# Patient Record
Sex: Male | Born: 1995 | Race: White | Hispanic: No | Marital: Single | Smoking: Former smoker
Health system: Southern US, Community
[De-identification: ages and names within clinical notes are randomized; demographics above are authoritative.]

## PROBLEM LIST (undated history)

## (undated) VITALS — BP 115/68 | HR 81 | Temp 97.7°F | Resp 17 | Ht 70.87 in | Wt 135.6 lb

## (undated) DIAGNOSIS — R011 Cardiac murmur, unspecified: Secondary | ICD-10-CM

## (undated) DIAGNOSIS — F419 Anxiety disorder, unspecified: Secondary | ICD-10-CM

## (undated) DIAGNOSIS — F909 Attention-deficit hyperactivity disorder, unspecified type: Secondary | ICD-10-CM

## (undated) HISTORY — PX: NO PAST SURGERIES: SHX2092

---
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
## 2014-01-27 ENCOUNTER — Encounter (HOSPITAL_COMMUNITY): Payer: Self-pay | Admitting: Emergency Medicine

## 2014-01-27 ENCOUNTER — Emergency Department (HOSPITAL_COMMUNITY)
Admission: EM | Admit: 2014-01-27 | Discharge: 2014-01-28 | Disposition: A | Discharge status: Home / Self Care | Attending: Emergency Medicine | Admitting: Emergency Medicine

## 2014-01-27 DIAGNOSIS — R011 Cardiac murmur, unspecified: Secondary | ICD-10-CM | POA: Insufficient documentation

## 2014-01-27 DIAGNOSIS — F1210 Cannabis abuse, uncomplicated: Secondary | ICD-10-CM | POA: Insufficient documentation

## 2014-01-27 DIAGNOSIS — E876 Hypokalemia: Secondary | ICD-10-CM | POA: Insufficient documentation

## 2014-01-27 DIAGNOSIS — F17200 Nicotine dependence, unspecified, uncomplicated: Secondary | ICD-10-CM | POA: Insufficient documentation

## 2014-01-27 DIAGNOSIS — F419 Anxiety disorder, unspecified: Secondary | ICD-10-CM

## 2014-01-27 DIAGNOSIS — F32A Depression, unspecified: Secondary | ICD-10-CM

## 2014-01-27 DIAGNOSIS — F3289 Other specified depressive episodes: Secondary | ICD-10-CM | POA: Insufficient documentation

## 2014-01-27 DIAGNOSIS — F411 Generalized anxiety disorder: Secondary | ICD-10-CM | POA: Insufficient documentation

## 2014-01-27 DIAGNOSIS — F329 Major depressive disorder, single episode, unspecified: Secondary | ICD-10-CM | POA: Insufficient documentation

## 2014-01-27 NOTE — ED Notes (Signed)
Pt states increased stress causing mood swings and panic attacks for last 2 months. Pt reports emesis and decreased appetite with feelings of anxiety. Pt denies SI/HI.

## 2014-01-28 LAB — COMPREHENSIVE METABOLIC PANEL
ALT: 12 U/L (ref 0–53)
AST: 14 U/L (ref 0–37)
Albumin: 4 g/dL (ref 3.5–5.2)
Alkaline Phosphatase: 79 U/L (ref 39–117)
BUN: 10 mg/dL (ref 6–23)
CALCIUM: 9.5 mg/dL (ref 8.4–10.5)
CO2: 28 meq/L (ref 19–32)
CREATININE: 0.98 mg/dL (ref 0.50–1.35)
Chloride: 98 mEq/L (ref 96–112)
Glucose, Bld: 105 mg/dL — ABNORMAL HIGH (ref 70–99)
Potassium: 3.1 mEq/L — ABNORMAL LOW (ref 3.7–5.3)
Sodium: 141 mEq/L (ref 137–147)
Total Bilirubin: 0.3 mg/dL (ref 0.3–1.2)
Total Protein: 7.1 g/dL (ref 6.0–8.3)

## 2014-01-28 LAB — URINALYSIS, ROUTINE W REFLEX MICROSCOPIC
Bilirubin Urine: NEGATIVE
Glucose, UA: NEGATIVE mg/dL
HGB URINE DIPSTICK: NEGATIVE
Ketones, ur: NEGATIVE mg/dL
LEUKOCYTES UA: NEGATIVE
NITRITE: NEGATIVE
Protein, ur: NEGATIVE mg/dL
SPECIFIC GRAVITY, URINE: 1.014 (ref 1.005–1.030)
UROBILINOGEN UA: 0.2 mg/dL (ref 0.0–1.0)
pH: 6 (ref 5.0–8.0)

## 2014-01-28 LAB — CBC WITH DIFFERENTIAL/PLATELET
BASOS ABS: 0 10*3/uL (ref 0.0–0.1)
Basophils Relative: 0 % (ref 0–1)
EOS PCT: 2 % (ref 0–5)
Eosinophils Absolute: 0.2 10*3/uL (ref 0.0–0.7)
HEMATOCRIT: 43.5 % (ref 39.0–52.0)
Hemoglobin: 15.4 g/dL (ref 13.0–17.0)
LYMPHS PCT: 31 % (ref 12–46)
Lymphs Abs: 3.5 10*3/uL (ref 0.7–4.0)
MCH: 29.1 pg (ref 26.0–34.0)
MCHC: 35.4 g/dL (ref 30.0–36.0)
MCV: 82.2 fL (ref 78.0–100.0)
MONO ABS: 0.8 10*3/uL (ref 0.1–1.0)
Monocytes Relative: 7 % (ref 3–12)
Neutro Abs: 6.7 10*3/uL (ref 1.7–7.7)
Neutrophils Relative %: 59 % (ref 43–77)
Platelets: 294 10*3/uL (ref 150–400)
RBC: 5.29 MIL/uL (ref 4.22–5.81)
RDW: 13 % (ref 11.5–15.5)
WBC: 11.3 10*3/uL — AB (ref 4.0–10.5)

## 2014-01-28 LAB — RAPID URINE DRUG SCREEN, HOSP PERFORMED
Amphetamines: NOT DETECTED
Barbiturates: NOT DETECTED
Benzodiazepines: NOT DETECTED
COCAINE: NOT DETECTED
OPIATES: NOT DETECTED
Tetrahydrocannabinol: POSITIVE — AB

## 2014-01-28 LAB — ETHANOL: Alcohol, Ethyl (B): <11 mg/dL (ref 0–11)

## 2014-01-28 MED ORDER — SODIUM CHLORIDE 0.9 % IV SOLN
1000.0000 mL | Freq: Once | INTRAVENOUS | Status: AC
  Administered 2014-01-28: 1000 mL via INTRAVENOUS

## 2014-01-28 MED ORDER — NICOTINE 14 MG/24HR TD PT24: 14.0000 mg | MEDICATED_PATCH | Freq: Every day | TRANSDERMAL | Status: DC

## 2014-01-28 MED ORDER — POTASSIUM CHLORIDE CRYS ER 20 MEQ PO TBCR: 20.0000 meq | EXTENDED_RELEASE_TABLET | Freq: Every day | ORAL | Status: AC

## 2014-01-28 MED ORDER — IBUPROFEN 200 MG PO TABS
600.0000 mg | ORAL_TABLET | Freq: Three times a day (TID) | ORAL | Status: DC | PRN
Start: 2014-01-28 — End: 2014-01-28

## 2014-01-28 MED ORDER — ONDANSETRON HCL 4 MG/2ML IJ SOLN: 4.0000 mg | Freq: Once | INTRAMUSCULAR | Status: DC

## 2014-01-28 MED ORDER — SODIUM CHLORIDE 0.9 % IV SOLN
1000.0000 mL | INTRAVENOUS | Status: DC
  Administered 2014-01-28: 1000 mL via INTRAVENOUS

## 2014-01-28 MED ORDER — POTASSIUM CHLORIDE CRYS ER 20 MEQ PO TBCR
40.0000 meq | EXTENDED_RELEASE_TABLET | ORAL | Status: DC
  Administered 2014-01-28: 40 meq via ORAL
  Filled 2014-01-28: qty 2

## 2014-01-28 MED ORDER — LORAZEPAM 1 MG PO TABS
1.0000 mg | ORAL_TABLET | Freq: Three times a day (TID) | ORAL | Status: DC | PRN
Start: 2014-01-28 — End: 2014-01-28

## 2014-01-28 MED ORDER — ONDANSETRON HCL 4 MG PO TABS: 4.0000 mg | ORAL_TABLET | Freq: Three times a day (TID) | ORAL | Status: DC | PRN

## 2014-01-28 MED ORDER — ALUM & MAG HYDROXIDE-SIMETH 200-200-20 MG/5ML PO SUSP: 30.0000 mL | ORAL | Status: DC | PRN

## 2014-01-28 MED ORDER — ZOLPIDEM TARTRATE 5 MG PO TABS: 5.0000 mg | ORAL_TABLET | Freq: Every evening | ORAL | Status: DC | PRN

## 2014-01-28 NOTE — ED Notes (Signed)
Pt is unable to urinate at this time. Pt has urinal.  

## 2014-01-28 NOTE — Discharge Instructions (Signed)
Generalized Anxiety Disorder Generalized anxiety disorder (GAD) is a mental disorder. It interferes with life functions, including relationships, work, and school. GAD is different from normal anxiety, which everyone experiences at some point in their lives in response to specific life events and activities. Normal anxiety actually helps [place] prepare for and get through these life events and activities. Normal anxiety goes away after the event or activity is over.  GAD causes anxiety that is not necessarily related to specific events or activities. It also causes excess anxiety in proportion to specific events or activities. The anxiety associated with GAD is also difficult to control. GAD can vary from mild to severe. People with severe GAD can have intense waves of anxiety with physical symptoms (panic attacks).  SYMPTOMS The anxiety and worry associated with GAD are difficult to control. This anxiety and worry are related to many life events and activities and also occur more days than not for 6 months or longer. People with GAD also have three or more of the following symptoms (one or more in children):  Restlessness.   Fatigue.  Difficulty concentrating.   Irritability.  Muscle tension.  Difficulty sleeping or unsatisfying sleep. DIAGNOSIS GAD is diagnosed through an assessment by your caregiver. Your caregiver will ask you questions aboutyour mood,physical symptoms, and events in your life. Your caregiver may ask you about your medical history and use of alcohol or drugs, including prescription medications. Your caregiver may also do a physical exam and blood tests. Certain medical conditions and the use of certain substances can cause symptoms similar to those associated with GAD. Your caregiver may refer you to a mental health specialist for further evaluation. TREATMENT The following therapies are usually used to treat GAD:   Medication Antidepressant medication usually is  prescribed for long-term daily control. Antianxiety medications may be added in severe cases, especially when panic attacks occur.   Talk therapy (psychotherapy) Certain types of talk therapy can be helpful in treating GAD by providing support, education, and guidance. A form of talk therapy called cognitive behavioral therapy can teach you healthy ways to think about and react to daily life events and activities.  Stress managementtechniques These include yoga, meditation, and exercise and can be very helpful when they are practiced regularly. A mental health specialist can help determine which treatment is best for you. Some people see improvement with one therapy. However, other people require a combination of therapies. Document Released: 02/21/2013 Document Reviewed: 02/21/2013 [facility] Patient Information 2014 [place], [place].  Depression, Adult Depression refers to feeling sad, low, down in the dumps, blue, gloomy, or empty. In general, there are two kinds of depression: 1. Depression that we all experience from time to time because of upsetting life experiences, including the loss of a job or the ending of a relationship (normal sadness or normal grief). This kind of depression is considered normal, is short lived, and resolves within a few days to 2 weeks. (Depression experienced after the loss of a loved one is called bereavement. Bereavement often lasts longer than 2 weeks but normally gets better with time.) 2. Clinical depression, which lasts longer than normal sadness or normal grief or interferes with your ability to function at home, at work, and in school. It also interferes with your personal relationships. It affects almost every aspect of your life. Clinical depression is an illness. Symptoms of depression also can be caused by conditions other than normal sadness and grief or clinical depression. Examples of these conditions are listed as  follows:  Physical illness Some physical  illnesses, including underactive thyroid gland (hypothyroidism), severe anemia, specific types of cancer, diabetes, uncontrolled seizures, heart and lung problems, strokes, and chronic pain are commonly associated with symptoms of depression.  Side effects of some prescription medicine In some people, certain types of prescription medicine can cause symptoms of depression.  Substance abuse Abuse of alcohol and illicit drugs can cause symptoms of depression. SYMPTOMS Symptoms of normal sadness and normal grief include the following:  Feeling sad or crying for short periods of time.  Not caring about anything (apathy).  Difficulty sleeping or sleeping too much.  No longer able to enjoy the things you used to enjoy.  Desire to be by oneself all the time (social isolation).  Lack of energy or motivation.  Difficulty concentrating or remembering.  Change in appetite or weight.  Restlessness or agitation. Symptoms of clinical depression include the same symptoms of normal sadness or normal grief and also the following symptoms:  Feeling sad or crying all the time.  Feelings of guilt or worthlessness.  Feelings of hopelessness or helplessness.  Thoughts of suicide or the desire to harm yourself (suicidal ideation).  Loss of touch with reality (psychotic symptoms). Seeing or hearing things that are not real (hallucinations) or having false beliefs about your life or the people around you (delusions and paranoia). DIAGNOSIS  The diagnosis of clinical depression usually is based on the severity and duration of the symptoms. Your caregiver also will ask you questions about your medical history and substance use to find out if physical illness, use of prescription medicine, or substance abuse is causing your depression. Your caregiver also may order blood tests. TREATMENT  Typically, normal sadness and normal grief do not require treatment. However, sometimes antidepressant medicine is  prescribed for bereavement to ease the depressive symptoms until they resolve. The treatment for clinical depression depends on the severity of your symptoms but typically includes antidepressant medicine, counseling with a mental health professional, or a combination of both. Your caregiver will help to determine what treatment is best for you. Depression caused by physical illness usually goes away with appropriate medical treatment of the illness. If prescription medicine is causing depression, talk with your caregiver about stopping the medicine, decreasing the dose, or substituting another medicine. Depression caused by abuse of alcohol or illicit drugs abuse goes away with abstinence from these substances. Some adults need professional help in order to stop drinking or using drugs. SEEK IMMEDIATE CARE IF:  You have thoughts about hurting yourself or others.  You lose touch with reality (have psychotic symptoms).  You are taking medicine for depression and have a serious side effect. FOR MORE INFORMATION National [employer] on Mental Illness: www.nami.[employer]: http://www.maynard.net/ Document Released: 10/24/2000 Document Revised: 04/27/2012 Document Reviewed: 01/26/2012 [facility] Patient Information 2014 [place], [place].  Potassium Salts tablets, extended-release tablets or capsules What is this medicine? POTASSIUM (poe TASS i um) is a natural salt that is important for the heart, muscles, and nerves. It is found in many foods and is normally supplied by a well balanced diet. This medicine is used to treat low potassium. This medicine may be used for other purposes; ask your health care provider or pharmacist if you have questions. COMMON BRAND NAME(S): ED-K+10, Glu-K, K-10 , K-8, K-Dur, K-Tab, Kaon-CL, Klor-Con M10, Klor-Con M15, Klor-Con M20, Klor-Con, Klotrix, Micro-K Extencaps, Micro-K, Slow-K What should I tell my health care provider before I take this  medicine? They need to  know if you have any of these conditions: -dehydration -diabetes -irregular heartbeat -kidney disease -stomach ulcers or other stomach problems -an unusual or allergic reaction to potassium salts, other medicines, foods, dyes, or preservatives -pregnant or trying to get pregnant -breast-feeding How should I use this medicine? Take this medicine by mouth with a full glass of water. Follow the directions on the prescription label. Take with food. Do not suck on, crush, or chew this medicine. If you have difficulty swallowing, ask the pharmacist how to take. Take your medicine at regular intervals. Do not take it more often than directed. Do not stop taking except on your doctor's advice. Talk to your pediatrician regarding the use of this medicine in children. Special care may be needed. Overdosage: If you think you have taken too much of this medicine contact a poison control center or emergency room at once. NOTE: This medicine is only for you. Do not share this medicine with others. What if I miss a dose? If you miss a dose, take it as soon as you can. If it is almost time for your next dose, take only that dose. Do not take double or extra doses. What may interact with this medicine? Do not take this medicine with any of the following medications: -eplerenone -sodium polystyrene sulfonate This medicine may also interact with the following medications: -medicines for blood pressure or heart disease like lisinopril, losartan, quinapril, valsartan -medicines for cold or allergies -medicines for inflammation like ibuprofen, indomethacin -medicines for Parkinson's disease -medicines for the stomach like metoclopramide, dicyclomine, glycopyrrolate -some diuretics This list may not describe all possible interactions. Give your health care provider a list of all the medicines, herbs, non-prescription drugs, or dietary supplements you use. Also tell them if you smoke,  drink alcohol, or use illegal drugs. Some items may interact with your medicine. What should I watch for while using this medicine? Visit your doctor or health care professional for regular check ups. You will need lab work done regularly. You may need to be on a special diet while taking this medicine. Ask your doctor. What side effects may I notice from receiving this medicine? Side effects that you should report to your doctor or health care professional as soon as possible: -allergic reactions like skin rash, itching or hives, swelling of the face, lips, or tongue -black, tarry stools -heartburn -irregular heartbeat -numbness or tingling in hands or feet -pain when swallowing -unusually weak or tired Side effects that usually do not require medical attention (report to your doctor or health care professional if they continue or are bothersome): -diarrhea -nausea -stomach gas -vomiting This list may not describe all possible side effects. Call your doctor for medical advice about side effects. You may report side effects to FDA at 1-800-FDA-1088. Where should I keep my medicine? Keep out of the reach of children. Store at room temperature between 15 and 30 degrees C (59 and 86 degrees F ). Keep bottle closed tightly to protect this medicine from light and moisture. Throw away any unused medicine after the expiration date. NOTE: This sheet is a summary. It may not cover all possible information. If you have questions about this medicine, talk to your doctor, pharmacist, or health care provider.  2014, [employer]. (2008-01-12 11:17:31)

## 2014-01-28 NOTE — ED Provider Notes (Addendum)
CSN: [id number]     Arrival date & time 01/27/14  2307 History   First MD Initiated Contact with Patient 01/28/14 0035     Chief Complaint  Patient presents with  . Anxiety     (Consider location/radiation/quality/duration/timing/severity/associated sxs/prior Treatment) Patient is a 18 y.o. male presenting with anxiety. The history is provided by the patient.  Anxiety  He complains of increased anxiety for the last 4-6 weeks. During this time, he states that he has not been eating, although he states that he has a normal appetite. There's been some intermittent vomiting. He admits to some depression but denies suicidal ideation or homicidal ideation. He has had early morning awakening but he denies crying spells or anhedonia. He denies hallucinations. He denies illicit drug use. He has had problems with anxiety in the past but this is worse than normal.  Past Medical History  Diagnosis Date  . ADHD (attention deficit hyperactivity disorder)   . Anxiety   . Heart murmur    Past Surgical History  Procedure Laterality Date  . No past surgeries     Family History  Problem Relation Age of Onset  . Mental illness Mother     bipolar, depression  . Alcohol abuse Mother   . Diabetes Maternal Grandmother   . Hyperlipidemia Maternal Grandmother   . Anxiety disorder Father   . Drug abuse Maternal Aunt    History  Substance Use Topics  . Smoking status: Current Every Day Smoker -- 1.00 packs/day for 2.5 years    Types: Cigarettes  . Smokeless tobacco: Current User    Types: Snuff  . Alcohol Use: No    Review of Systems  All other systems reviewed and are negative.      Allergies  Tylenol  Home Medications   Current Outpatient Rx  Name  Route  Sig  Dispense  Refill  . cyclobenzaprine (FLEXERIL) 10 MG tablet   Oral   Take 10 mg by mouth 3 (three) times daily as needed for muscle spasms.          BP 113/66  Pulse 98  Temp(Src) 98.7 F (37.1 C) (Oral)  Resp 16  Ht  6\' 1"  (1.854 m)  Wt 145 lb (65.772 kg)  BMI 19.13 kg/m2  SpO2 98% Physical Exam  Nursing note and vitals reviewed.  18 year old male, resting comfortably and in no acute distress. Vital signs are normal. Oxygen saturation is 98%, which is normal. Head is normocephalic and atraumatic. PERRLA, EOMI. Oropharynx is clear. Neck is nontender and supple without adenopathy or JVD. Back is nontender and there is no CVA tenderness. Lungs are clear without rales, wheezes, or rhonchi. Chest is nontender. Heart has regular rate and rhythm without murmur. Abdomen is soft, flat, nontender without masses or hepatosplenomegaly and peristalsis is normoactive. Extremities have no cyanosis or edema, full range of motion is present. Skin is warm and dry without rash. Neurologic: Mental status is normal, cranial nerves are intact, there are no motor or sensory deficits. Psychiatric: Somewhat depressed affect. He makes poor eye contact and voice is very soft and he makes poor eye contact.  ED Course  Procedures (including critical care time) Labs Review Results for orders placed during the hospital encounter of 01/27/14  CBC WITH DIFFERENTIAL      Result Value Ref Range   WBC 11.3 (*) 4.0 - 10.5 K/uL   RBC 5.29  4.22 - 5.81 MIL/uL   Hemoglobin 15.4  13.0 - 17.0 g/dL  HCT 43.5  39.0 - 52.0 %   MCV 82.2  78.0 - 100.0 fL   MCH 29.1  26.0 - 34.0 pg   MCHC 35.4  30.0 - 36.0 g/dL   RDW 16.1  09.6 - 04.5 %   Platelets 294  150 - 400 K/uL   Neutrophils Relative % 59  43 - 77 %   Neutro Abs 6.7  1.7 - 7.7 K/uL   Lymphocytes Relative 31  12 - 46 %   Lymphs Abs 3.5  0.7 - 4.0 K/uL   Monocytes Relative 7  3 - 12 %   Monocytes Absolute 0.8  0.1 - 1.0 K/uL   Eosinophils Relative 2  0 - 5 %   Eosinophils Absolute 0.2  0.0 - 0.7 K/uL   Basophils Relative 0  0 - 1 %   Basophils Absolute 0.0  0.0 - 0.1 K/uL  COMPREHENSIVE METABOLIC PANEL      Result Value Ref Range   Sodium 141  137 - 147 mEq/L   Potassium  3.1 (*) 3.7 - 5.3 mEq/L   Chloride 98  96 - 112 mEq/L   CO2 28  19 - 32 mEq/L   Glucose, Bld 105 (*) 70 - 99 mg/dL   BUN 10  6 - 23 mg/dL   Creatinine, Ser 4.09  0.50 - 1.35 mg/dL   Calcium 9.5  8.4 - 81.1 mg/dL   Total Protein 7.1  6.0 - 8.3 g/dL   Albumin 4.0  3.5 - 5.2 g/dL   AST 14  0 - 37 U/L   ALT 12  0 - 53 U/L   Alkaline Phosphatase 79  39 - 117 U/L   Total Bilirubin 0.3  0.3 - 1.2 mg/dL   GFR calc non Af Amer >90  >90 mL/min   GFR calc Af Amer >90  >90 mL/min  URINALYSIS, ROUTINE W REFLEX MICROSCOPIC      Result Value Ref Range   Color, Urine YELLOW  YELLOW   APPearance CLEAR  CLEAR   Specific Gravity, Urine 1.014  1.005 - 1.030   pH 6.0  5.0 - 8.0   Glucose, UA NEGATIVE  NEGATIVE mg/dL   Hgb urine dipstick NEGATIVE  NEGATIVE   Bilirubin Urine NEGATIVE  NEGATIVE   Ketones, ur NEGATIVE  NEGATIVE mg/dL   Protein, ur NEGATIVE  NEGATIVE mg/dL   Urobilinogen, UA 0.2  0.0 - 1.0 mg/dL   Nitrite NEGATIVE  NEGATIVE   Leukocytes, UA NEGATIVE  NEGATIVE  URINE RAPID DRUG SCREEN (HOSP PERFORMED)      Result Value Ref Range   Opiates NONE DETECTED  NONE DETECTED   Cocaine NONE DETECTED  NONE DETECTED   Benzodiazepines NONE DETECTED  NONE DETECTED   Amphetamines NONE DETECTED  NONE DETECTED   Tetrahydrocannabinol POSITIVE (*) NONE DETECTED   Barbiturates NONE DETECTED  NONE DETECTED  ETHANOL      Result Value Ref Range   Alcohol, Ethyl (B) <11  0 - 11 mg/dL   MDM   Final diagnoses:  Anxiety  Hypokalemia  Depression    Anxiety and depression. Screening labs are obtained. Because of complaints of nausea and decreased intake, he is given IV fluids and a dose of IV ondansetron. Consultation will be obtained with TTS.  Workup is significant for hypokalemia and is given potassium supplementation.  Dione Booze, MD 01/28/14 [phone]  After workup was completed, patient decided that he did not wish to be evaluated by psychiatry here and wished to be discharged.  He is given  referral to outpatient psychiatry. He is given a prescription for K-Dur.  Dione Boozeavid Shahzaib Azevedo, MD 01/28/14 [phone]

## 2015-01-17 IMAGING — CR DG ANKLE COMPLETE 3+V*L*
3 series · 3 of 3 positions shown · non-contrast
Comparison: None.

CLINICAL DATA: Fall with twisting injury. Pain and bruising, worst
laterally.

EXAM:
LEFT ANKLE COMPLETE - 3+ VIEW

[x ankle ap left]
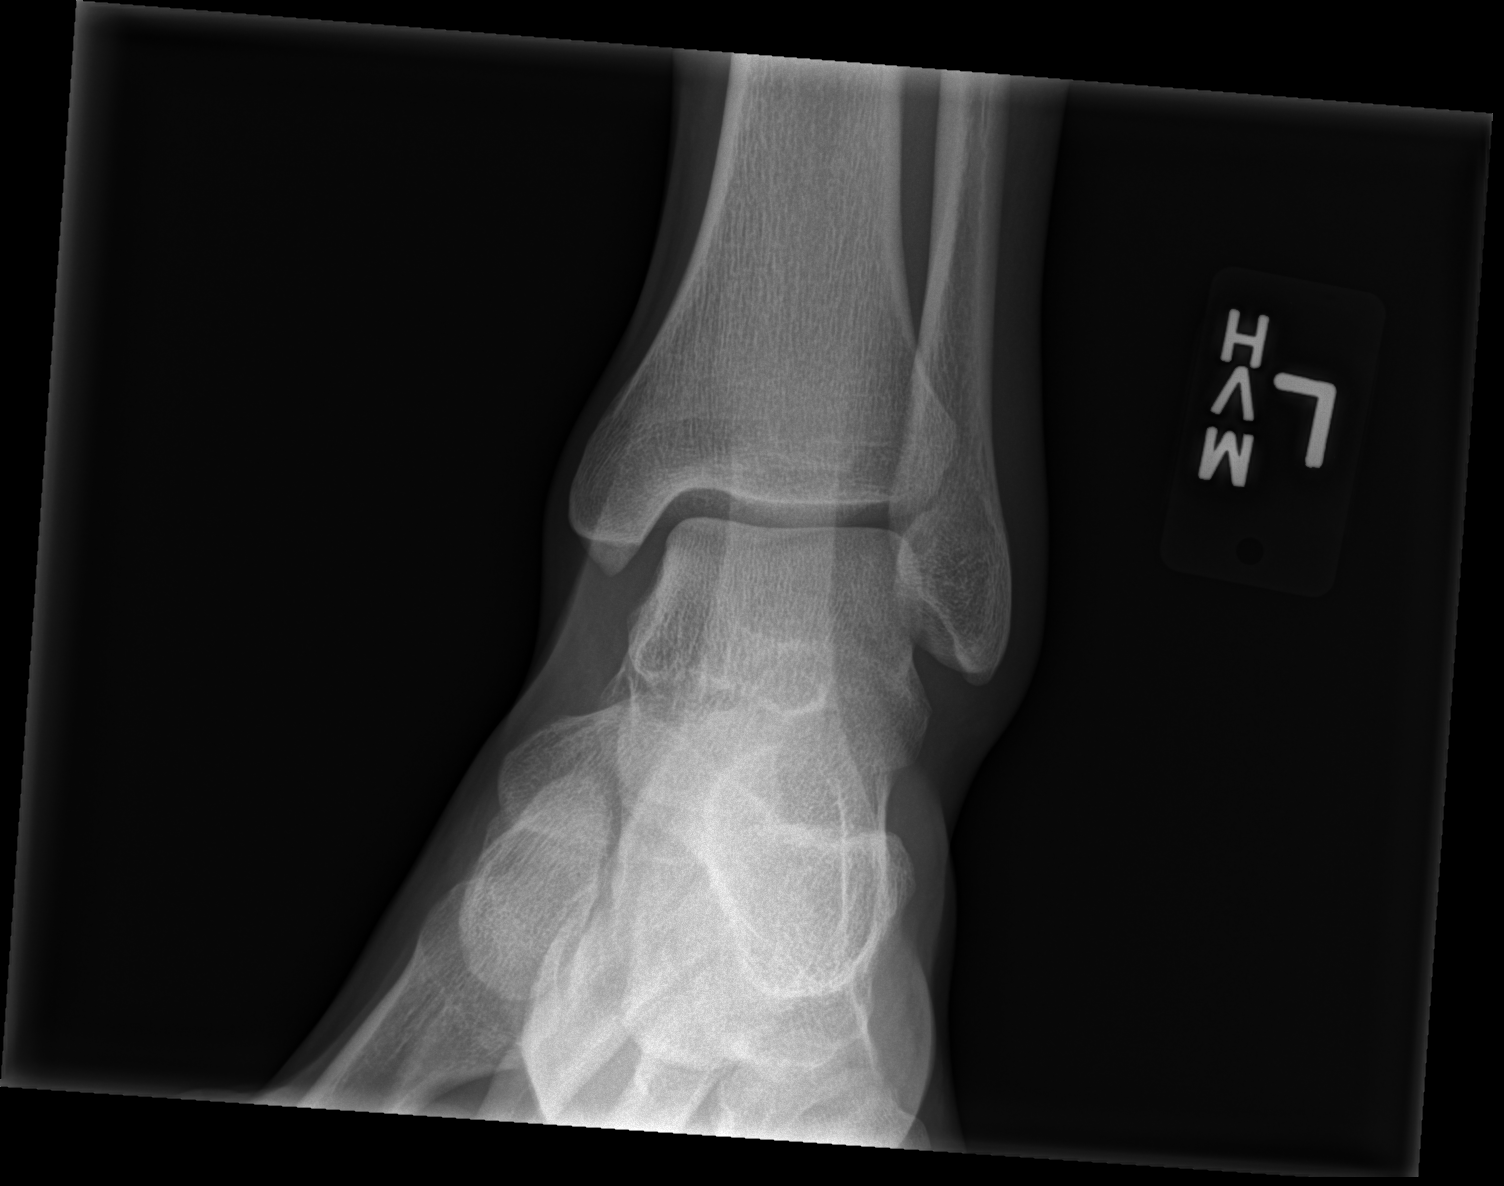

[x ankle obl left]
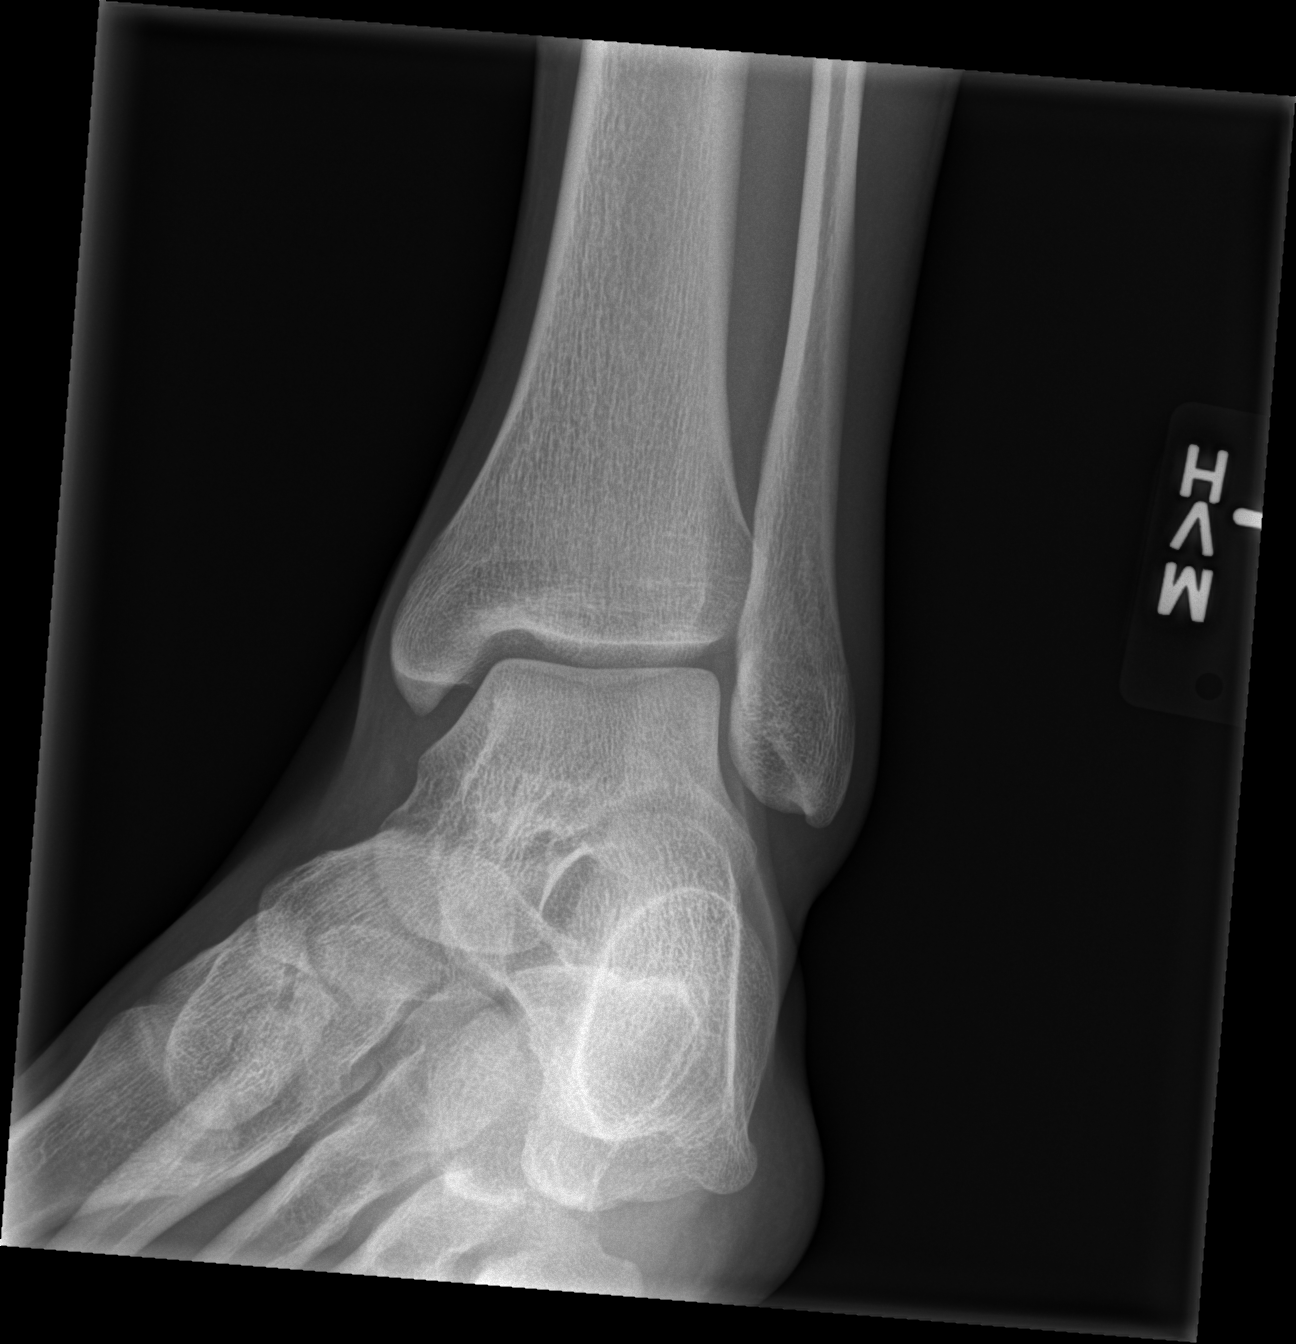

[x ankle lat left]
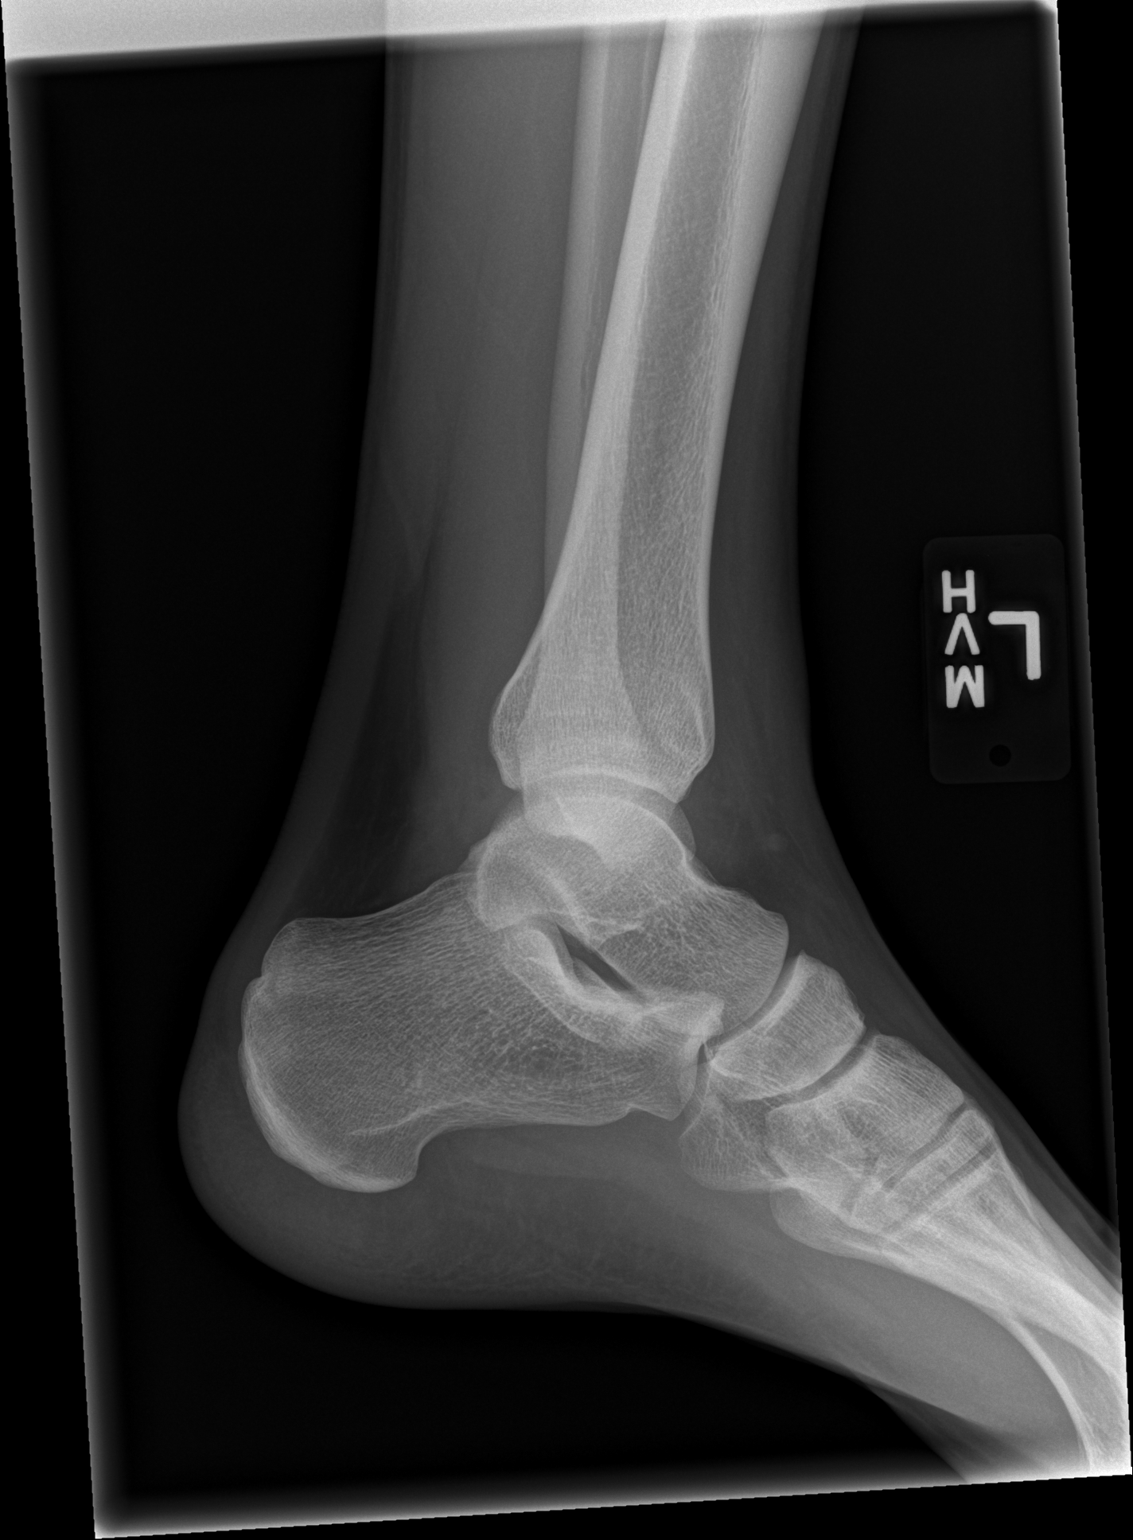

[3 of 3 positions shown; findings below may reference images not displayed]

FINDINGS: Minimal soft tissue swelling is seen over the lateral malleolus. No
acute fracture. Ankle mortise is intact. There may be a joint
effusion.
IMPRESSION: No acute osseous abnormality.  Suspect small joint effusion.

## 2015-02-02 IMAGING — CR DG CHEST 2V
2 series · 2 of 2 positions shown · non-contrast
Comparison: 07/01/2013

CLINICAL DATA: Cough and fever.

EXAM:
CHEST  2 VIEW

[w chest pa]
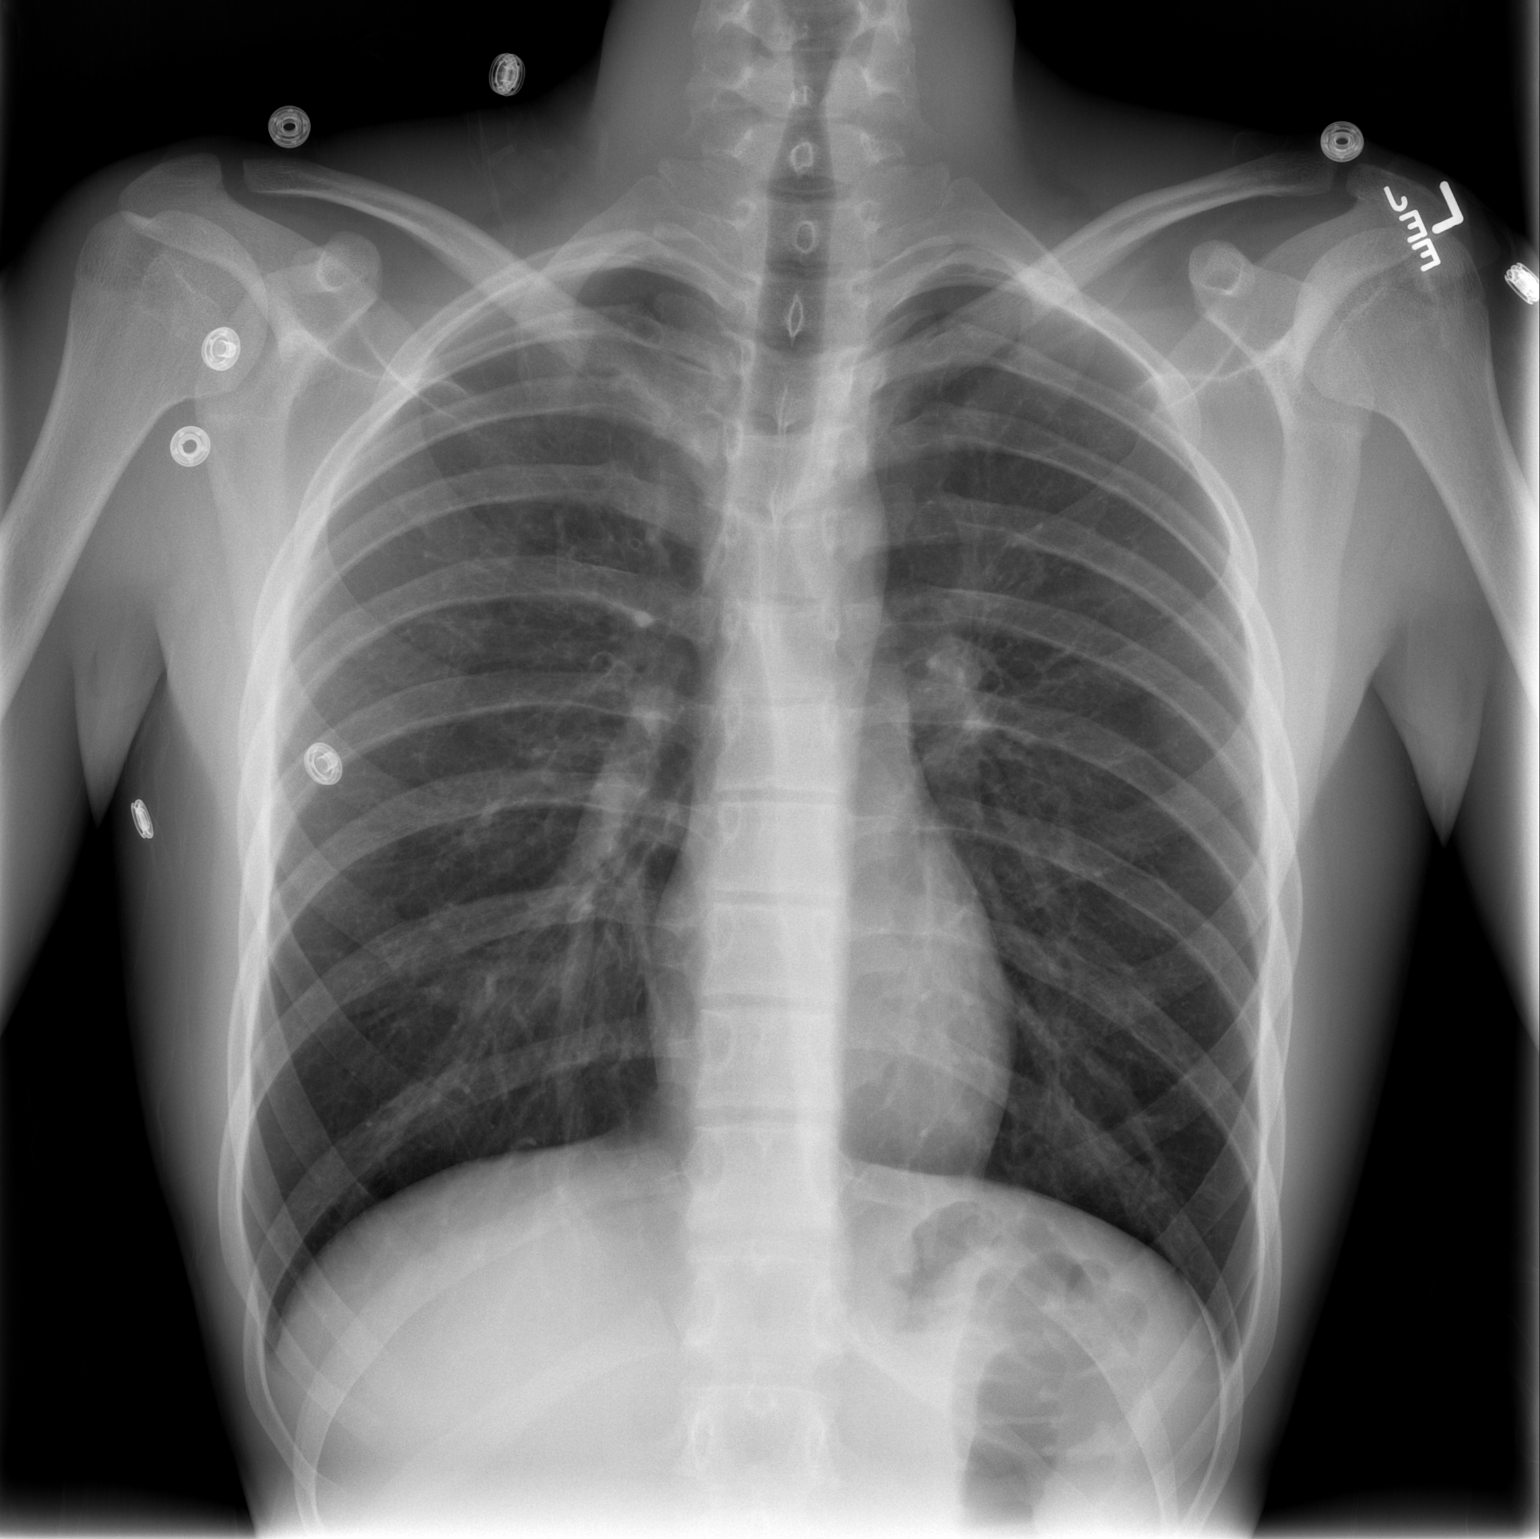

[w chest lat]
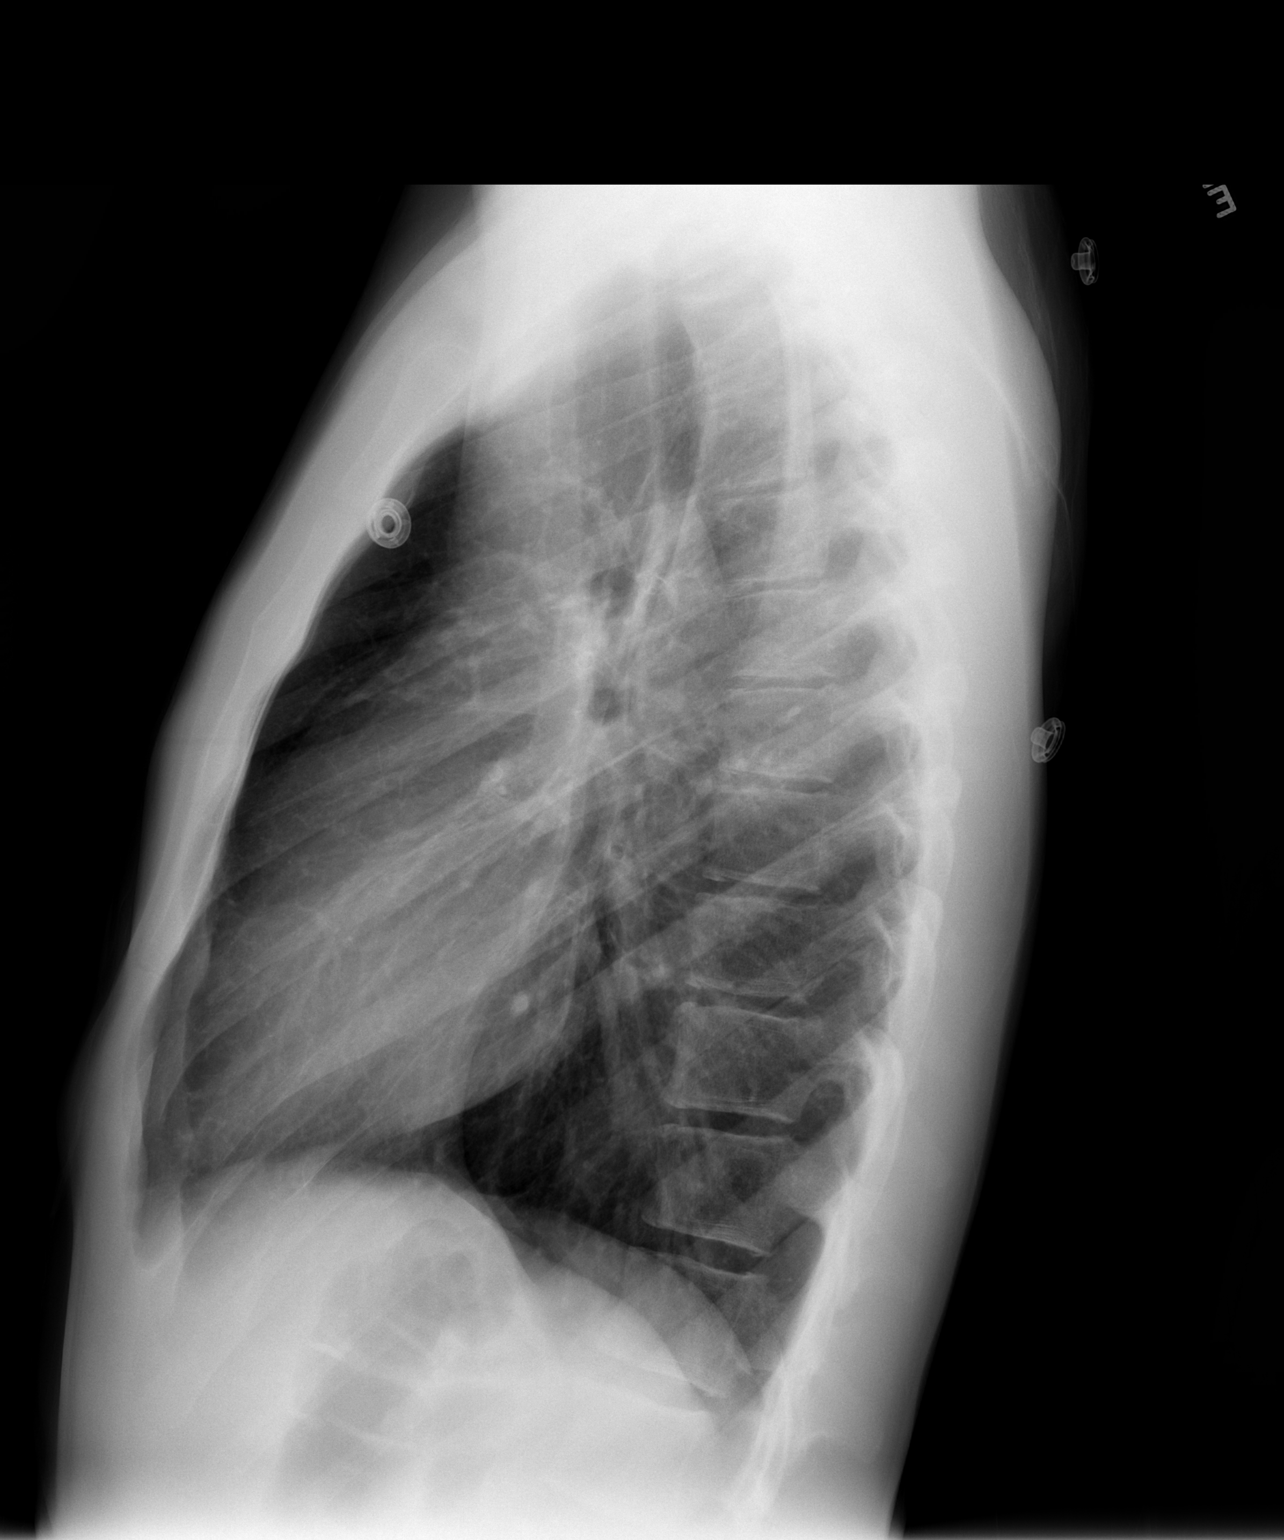

[2 of 2 positions shown; findings below may reference images not displayed]

FINDINGS: The heart size and mediastinal contours are within normal limits.
Both lungs are clear. The visualized skeletal structures are
unremarkable.
IMPRESSION: No active cardiopulmonary disease.

## 2021-03-10 ENCOUNTER — Other Ambulatory Visit: Payer: Self-pay

## 2021-03-10 ENCOUNTER — Encounter (HOSPITAL_COMMUNITY): Payer: Self-pay | Admitting: Emergency Medicine

## 2021-03-10 ENCOUNTER — Emergency Department (HOSPITAL_COMMUNITY)
Admission: EM | Admit: 2021-03-10 | Discharge: 2021-03-10 | Disposition: A | Discharge status: Home / Self Care | Attending: Emergency Medicine | Admitting: Emergency Medicine

## 2021-03-10 DIAGNOSIS — F17210 Nicotine dependence, cigarettes, uncomplicated: Secondary | ICD-10-CM | POA: Insufficient documentation

## 2021-03-10 DIAGNOSIS — K0889 Other specified disorders of teeth and supporting structures: Secondary | ICD-10-CM | POA: Diagnosis not present

## 2021-03-10 MED ORDER — AMOXICILLIN-POT CLAVULANATE 875-125 MG PO TABS: 1.0000 | ORAL_TABLET | Freq: Two times a day (BID) | ORAL | 0 refills | Status: AC

## 2021-03-10 MED ORDER — HYDROCODONE-ACETAMINOPHEN 5-325 MG PO TABS
1.0000 | ORAL_TABLET | Freq: Once | ORAL | Status: AC
  Administered 2021-03-10: 1 via ORAL
  Filled 2021-03-10: qty 1

## 2021-03-10 MED ORDER — AMOXICILLIN-POT CLAVULANATE 875-125 MG PO TABS
1.0000 | ORAL_TABLET | Freq: Once | ORAL | Status: AC
  Administered 2021-03-10: 1 via ORAL
  Filled 2021-03-10: qty 1

## 2021-03-10 MED ORDER — HYDROCODONE-ACETAMINOPHEN 5-325 MG PO TABS: 2.0000 | ORAL_TABLET | ORAL | 0 refills | Status: AC | PRN

## 2021-03-10 NOTE — ED Provider Notes (Signed)
[facility]-EMERGENCY DEPT Provider Note   CSN: [id number] Arrival date & time: 03/10/21  1549     History Chief Complaint  Patient presents with  . Dental Pain    Brendan Warren is a 25 y.o. male with past medical history significant for anxiety and ADHD.  HPI Patient presents to emergency room today with chief complaint of dental pain x2 weeks. He states the pain is located right upper and lower jaw. Patient states his pain has been progressively worsening.    He tells me that he is supposed to have his molars and wisdom teeth removed however has not yet been able to see a dentist recently.  He has had multiple teeth pulled in the past.  He has been taking Tylenol and ibuprofen for pain at home.  He reports no relief with those interventions. Denies fever, chills, voice change, inability to control secretions, nausea/vomiting, facial swelling, dysphagia, odynophagia, drainage or trauma  Past Medical History:  Diagnosis Date  . ADHD (attention deficit hyperactivity disorder)   . Anxiety   . Heart murmur     There are no problems to display for this patient.   Past Surgical History:  Procedure Laterality Date  . NO PAST SURGERIES         Family History  Problem Relation Age of Onset  . Mental illness Mother        bipolar, depression  . Alcohol abuse Mother   . Diabetes Maternal Grandmother   . Hyperlipidemia Maternal Grandmother   . Anxiety disorder Father   . Drug abuse Maternal Aunt     Social History   Tobacco Use  . Smoking status: Current Every Day Smoker    Packs/day: 1.00    Years: 2.50    Pack years: 2.50    Types: Cigarettes  . Smokeless tobacco: Current User    Types: Snuff  Substance Use Topics  . Alcohol use: No  . Drug use: No    Comment: last use 2 weeks ago    Home Medications Prior to Admission medications   Medication Sig Start Date End Date Taking? Authorizing Provider  amoxicillin-clavulanate (AUGMENTIN) 875-125  MG tablet Take 1 tablet by mouth every 12 (twelve) hours. 03/10/21  Yes Walisiewicz, Caroleen Hamman, PA-C  HYDROcodone-acetaminophen (NORCO/VICODIN) 5-325 MG tablet Take 2 tablets by mouth every 4 (four) hours as needed. 03/10/21  Yes Walisiewicz, Motty Borin E, PA-C  cyclobenzaprine (FLEXERIL) 10 MG tablet Take 10 mg by mouth 3 (three) times daily as needed for muscle spasms.    [provider]  potassium chloride SA (K-DUR,KLOR-CON) 20 MEQ tablet Take 1 tablet (20 mEq total) by mouth daily. 01/28/14   Dione Booze, MD  diphenhydrAMINE (BENADRYL) 25 mg capsule Take 25 mg by mouth every 6 (six) hours as needed for itching.  12/19/13  [provider]    Allergies    Tylenol [acetaminophen]  Review of Systems   Review of Systems All other systems are reviewed and are negative for acute change except as noted in the HPI.  Physical Exam Updated Vital Signs BP 112/76 (BP Location: Right Arm)   Pulse 68   Temp 98.2 F (36.8 C) (Oral)   Resp 16   SpO2 99%   Physical Exam Vitals and nursing note reviewed.  Constitutional:      General: He is not in acute distress.    Appearance: He is not toxic-appearing.  HENT:     Head: Normocephalic and atraumatic.     Nose:  Nose normal.     Mouth/Throat:     Comments: Multiple missing teeth.  Chronic dental decay.  Right upper and lower molars are fractured with large piece of tooth missing. No noted area of swelling or fluctuance. No trismus. Mouth opening to at least 3 finger widths. Handles oral secretions without difficulty. External exam shows no asymmetry of the jaw line or face, no signs of obvious swelling or infection. No swelling or tenderness to the submental or submandibular regions. No swelling or tenderness into the soft tissues of the neck.Full active range of motion of the jaw. Neck is supple with full active range of motion, no tenderness to palpation of the soft tissues.     Eyes:     General: No scleral icterus.        Right eye: No discharge.        Left eye: No discharge.     Conjunctiva/sclera: Conjunctivae normal.  Cardiovascular:     Rate and Rhythm: Normal rate and regular rhythm.     Pulses: Normal pulses.     Heart sounds: Normal heart sounds.  Pulmonary:     Effort: Pulmonary effort is normal.     Breath sounds: Normal breath sounds.  Abdominal:     General: There is no distension.  Musculoskeletal:        General: Normal range of motion.     Cervical back: Normal range of motion. No muscular tenderness.  Skin:    General: Skin is warm and dry.  Neurological:     Mental Status: He is oriented to person, place, and time.  Psychiatric:        Behavior: Behavior normal.     ED Results / Procedures / Treatments   Labs (all labs ordered are listed, but only abnormal results are displayed) Labs Reviewed - No data to display  EKG None  Radiology No results found.  Procedures Procedures   Medications Ordered in ED Medications  amoxicillin-clavulanate (AUGMENTIN) 875-125 MG per tablet 1 tablet (has no administration in time range)  HYDROcodone-acetaminophen (NORCO/VICODIN) 5-325 MG per tablet 1 tablet (has no administration in time range)    ED Course  I have reviewed the triage vital signs and the nursing notes.  Pertinent labs & imaging results that were available during my care of the patient were reviewed by me and considered in my medical decision making (see chart for details).    MDM Rules/Calculators/A&P                          History provided by patient with additional history obtained from chart review.    Pt is well appearing and is presenting with dental pain. Differential Diagnosis includes but is not limited to: toothache, abscess, periapical abscess, dental caries, cracked tooth, maxillary sinusitis, gingivitis, gum hyperplasia, tooth fracture, tooth dislocation. On exam patient has chronic dental decay. Patient with toothache.  No gross abscess.  Exam  unconcerning for Ludwig's angina or spread of infection.  Will treat with augmentin and short course of Norco for severe pain medicine. I have reviewed the PDMP during this encounter. Patient has no recent narcotic prescriptions.  Urged patient to follow-up with dentist and given dental resource list. VSS.  Pt appears stable for d/c. Strict return precautions discussed.    Portions of this note were generated with Scientist, clinical (histocompatibility and immunogenetics). Dictation errors may occur despite best attempts at proofreading.   Final Clinical Impression(s) / ED Diagnoses Final diagnoses:  Pain, dental    Rx / DC Orders ED Discharge Orders         Ordered    HYDROcodone-acetaminophen (NORCO/VICODIN) 5-325 MG tablet  Every 4 hours PRN        03/10/21 1652    amoxicillin-clavulanate (AUGMENTIN) 875-125 MG tablet  Every 12 hours        03/10/21 1652           Shanon Ace, PA-C 03/10/21 1703    Derwood Kaplan, MD 03/12/21 1646

## 2021-03-10 NOTE — Discharge Instructions (Addendum)
Today you were seen for dental pain.  If you start to experience and new or worsening symptoms return to the emergency department. If you start to experience fever, chills, neck stiffness/pain, or inability to move your neck or open your mouth come back to the emergency department immediately. If you begin to experience any blistering, rashes, swelling, or difficulty breathing seek medical care for evaluation of potentially more serious side effects.  Medications:  -I have prescribed you an antibiotic  Augmentin to treat the infection.  You were given the first dose here so start taking it tomorrow morning. -Prescription sent for Norco.  This is for severe pain only.  Do not drive or work while taking as it can make you drowsy.  You can try taking 1 pill every 6 hours to help them last longer. - You can also gargle warm salt water up to 5 times daily.  Both of these rinses can help to remove bacteria from the mouth.  You can apply a cool compress for 15 to 20 minutes, but avoid applying heat to the area. -Continue usual home medications.  2. Follow Up: Use the resource guide listed below to help you find a dentist if you do not already have one to follow up with. It is very important that you get evaluated by a dentist as soon as possible. Call tomorrow to schedule an appointment. Read the instructions below.     Be sure to eat something when taking the ibuprofen as it can cause stomach upset and at worst stomach bleeding. Do not take additional non steroidal anti-inflammatory medicines such as Ibuprofen, Aleve, Advil, Mobic, Diclofenac, or goodie powder while taking Naproxen. You may supplement with Tylenol.    Dental Care: Organization         Address  Phone  Notes  [facility] Department of [facility] [address] [place], [place] [phone] Accepts children up to age 38 who are enrolled in [place] or [place] Health Choice; pregnant women with a Medicaid card;  and children who have applied for Medicaid or [place] Health Choice, but were declined, whose parents can pay a reduced fee at time of service.  [facility] Department of [facility]  [address], [place] [phone] Accepts children up to age 69 who are enrolled in [place] or [place] Health Choice; pregnant women with a Medicaid card; and children who have applied for Medicaid or [place] Health Choice, but were declined, whose parents can pay a reduced fee at time of service.  [facility]  [address] [place], [place] [phone] Patients are seen by appointment only. Walk-ins are not accepted. [facility] will see patients 43 years of age and older. Monday - Tuesday (8am-5pm) Most Wednesdays (8:30-5pm) $30 per visit, cash only  [facility]  [address], [facility] [phone] Patients are seen by appointment only. Walk-ins are not accepted. [facility] will see patients 5 years of age and older. One Wednesday Evening (Monthly: Volunteer Based).  $30 per visit, cash only  [employer]  [phone] for adults; Children under age 34, call Graduate Pediatric Dentistry at [phone]. Children aged 66-14, please call [phone] to request a pediatric application.  Dental services are provided in all areas of dental care including fillings, crowns and bridges, complete and partial dentures, implants, gum treatment, root canals, and extractions. Preventive care is also provided.  Treatment is provided to both adults and children. Patients are selected via a lottery and there is often a waiting list.   [facility] [address] [place]  [phone] [email or web address]   [facility] [address] [place], [place] [phone], Ext. [phone] Second and Fourth Thursday of each month, opens at 6:30 AM; Clinic ends at 9 AM.  Patients are seen on a first-come  first-served basis, and a limited number are seen during each clinic.   [facility]  [address] [place], [place] [phone]   Eligibility Requirements You must have lived in [place], [place], or [place] counties for at least the last three months.   You cannot be eligible for state or federal sponsored [place], including [employer], [place], or [employer].   You generally cannot be eligible for healthcare insurance through your employer.    How to apply: Eligibility screenings are held every Tuesday and Wednesday afternoon from 1:00 pm until 4:00 pm. You do not need an appointment for the interview!  [facility] [address] [place], [place] [phone]   [facility]  [phone]   [facility] Health Department  [phone]   [facility]  [phone]    RESOURCE GUIDE:  Dental Problems  Patients with Medicaid: [facility] [phone] [address]                                [phone] [address] [employer] [address] Phone:  [phone]                                                  Phone:  [phone]  If unable to pay or uninsured, contact:  [facility] or [facility]. to become qualified for the adult dental clinic.  Insufficient Money for Medicine Contact United [employer]:  call "211" or [facility] [phone].  No Primary Care Doctor Call [facility]  [phone] Other agencies that provide inexpensive medical care    [facility]  [phone]    [facility]  [phone]    [facility]  [phone]    [facility]  [phone]    [facility]  [phone]    [facility]  [phone]

## 2021-03-10 NOTE — ED Triage Notes (Signed)
Reports dental pain x2 weeks, states he is supposed to be seeing an oral surgeon later this week to have his wisdom teeth removed but pain is severe. Denies fevers.

## 2021-09-18 ENCOUNTER — Emergency Department (HOSPITAL_COMMUNITY)
Admission: EM | Admit: 2021-09-18 | Discharge: 2021-09-19 | Disposition: A | Discharge status: Home / Self Care | Attending: Emergency Medicine | Admitting: Emergency Medicine

## 2021-09-18 ENCOUNTER — Other Ambulatory Visit: Payer: Self-pay

## 2021-09-18 ENCOUNTER — Encounter (HOSPITAL_COMMUNITY): Payer: Self-pay | Admitting: Emergency Medicine

## 2021-09-18 DIAGNOSIS — S39012A Strain of muscle, fascia and tendon of lower back, initial encounter: Secondary | ICD-10-CM | POA: Insufficient documentation

## 2021-09-18 DIAGNOSIS — Y990 Civilian activity done for income or pay: Secondary | ICD-10-CM | POA: Insufficient documentation

## 2021-09-18 DIAGNOSIS — M62830 Muscle spasm of back: Secondary | ICD-10-CM

## 2021-09-18 DIAGNOSIS — F17210 Nicotine dependence, cigarettes, uncomplicated: Secondary | ICD-10-CM | POA: Insufficient documentation

## 2021-09-18 DIAGNOSIS — X501XXA Overexertion from prolonged static or awkward postures, initial encounter: Secondary | ICD-10-CM | POA: Insufficient documentation

## 2021-09-18 MED ORDER — CYCLOBENZAPRINE HCL 10 MG PO TABS: 5.0000 mg | ORAL_TABLET | Freq: Every day | ORAL | 0 refills | Status: AC

## 2021-09-18 MED ORDER — IBUPROFEN 200 MG PO TABS
600.0000 mg | ORAL_TABLET | Freq: Once | ORAL | Status: AC
  Administered 2021-09-19: 600 mg via ORAL
  Filled 2021-09-18: qty 3

## 2021-09-18 NOTE — ED Provider Notes (Signed)
[facility]-EMERGENCY DEPT Provider Note  CSN: [id number] Arrival date & time: 09/18/21 2006  Chief Complaint(s) Back Pain  HPI Brendan Warren is a 25 y.o. male who presents to the emergency department for left lower back pain.  Onset yesterday while at work.  Reports that he bent down to pick up a box and while coming up, twisted.  He immediately felt pain in his lower back that was moderate to severe.  Worse with movement twisting and palpation.  Patient reports finishing work with significant discomfort.  Denied any fall or trauma.  No bladder/bowel incontinence.  No lower extremity weakness or loss of sensation.  Reports that this morning when he awoke the pain was worse.  He has been taking Tylenol without relief.   Back Pain  Past Medical History Past Medical History:  Diagnosis Date   ADHD (attention deficit hyperactivity disorder)    Anxiety    Heart murmur    There are no problems to display for this patient.  Home Medication(s) Prior to Admission medications   Medication Sig Start Date End Date Taking? Authorizing Provider  cyclobenzaprine (FLEXERIL) 10 MG tablet Take 0.5-1 tablets (5-10 mg total) by mouth at bedtime for 10 days. 09/18/21 09/28/21 Yes Brendan Warren  amoxicillin-clavulanate (AUGMENTIN) 875-125 MG tablet Take 1 tablet by mouth every 12 (twelve) hours. 03/10/21   Brendan Ace, PA-C  HYDROcodone-acetaminophen (NORCO/VICODIN) 5-325 MG tablet Take 2 tablets by mouth every 4 (four) hours as needed. 03/10/21   Warren, Brendan Alanis E, PA-C  potassium chloride SA (K-DUR,KLOR-CON) 20 MEQ tablet Take 1 tablet (20 mEq total) by mouth daily. 01/28/14   Brendan Booze, Warren  diphenhydrAMINE (BENADRYL) 25 mg capsule Take 25 mg by mouth every 6 (six) hours as needed for itching.  12/19/13  Provider, Historical, Warren                                                                                                                                     Past Surgical History Past Surgical History:  Procedure Laterality Date   NO PAST SURGERIES     Family History Family History  Problem Relation Age of Onset   Mental illness Mother        bipolar, depression   Alcohol abuse Mother    Diabetes Maternal Grandmother    Hyperlipidemia Maternal Grandmother    Anxiety disorder Father    Drug abuse Maternal Aunt     Social History Social History   Tobacco Use   Smoking status: Every Day    Packs/day: 1.00    Years: 2.50    Pack years: 2.50    Types: Cigarettes   Smokeless tobacco: Current    Types: Snuff  Substance Use Topics   Alcohol use: No   Drug use: No    Comment: last use 2 weeks ago   Allergies Tylenol [acetaminophen]  Review of Systems Review of Systems  Musculoskeletal:  Positive for back pain.  All other systems are reviewed and are negative for acute change except as noted in the HPI  Physical Exam Vital Signs  I have reviewed the triage vital signs BP 94/63 (BP Location: Right Arm)   Pulse 86   Temp (!) 97.4 F (36.3 C) (Oral)   Resp 17   Ht 6\' 1"  (1.854 m)   Wt 68 kg   SpO2 99%   BMI 19.79 kg/m   Physical Exam Vitals reviewed.  Constitutional:      General: He is not in acute distress.    Appearance: He is well-developed. He is not diaphoretic.  HENT:     Head: Normocephalic and atraumatic.     Right Ear: External ear normal.     Left Ear: External ear normal.     Nose: Nose normal.     Mouth/Throat:     Mouth: Mucous membranes are moist.  Eyes:     General: No scleral icterus.    Conjunctiva/sclera: Conjunctivae normal.  Neck:     Trachea: Phonation normal.  Cardiovascular:     Rate and Rhythm: Normal rate and regular rhythm.  Pulmonary:     Effort: Pulmonary effort is normal. No respiratory distress.     Breath sounds: No stridor.  Abdominal:     General: There is no distension.  Musculoskeletal:     Cervical back: Normal range of motion.     Thoracic back: No bony  tenderness.     Lumbar back: Spasms and tenderness present. No bony tenderness. Decreased range of motion.       Back:  Neurological:     Mental Status: He is alert and oriented to person, place, and time.     Comments: Spine Exam: Strength: 5/5 throughout LE bilaterally  Sensation: Intact to light touch in proximal and distal LE bilaterally    Psychiatric:        Behavior: Behavior normal.    ED Results and Treatments Labs (all labs ordered are listed, but only abnormal results are displayed) Labs Reviewed - No data to display                                                                                                                       EKG  EKG Interpretation  Date/Time:    Ventricular Rate:    PR Interval:    QRS Duration:   QT Interval:    QTC Calculation:   R Axis:     Text Interpretation:         Radiology No results found.  Pertinent labs & imaging results that were available during my care of the patient were reviewed by me and considered in my medical decision making (see MDM for details).  Medications Ordered in ED Medications  ibuprofen (ADVIL) tablet 600 mg (has no administration in time range)  Procedures Procedures  (including critical care time)  Medical Decision Making / ED Course I have reviewed the nursing notes for this encounter and the patient's prior records (if available in EHR or on provided paperwork).  Brendan Warren was evaluated in Emergency Department on 09/18/2021 for the symptoms described in the history of present illness. He was evaluated in the context of the global COVID-19 pandemic, which necessitated consideration that the patient might be at risk for infection with the SARS-CoV-2 virus that causes COVID-19. Institutional protocols and algorithms that pertain to the evaluation of patients at risk  for COVID-19 are in a state of rapid change based on information released by regulatory bodies including the CDC and federal and state organizations. These policies and algorithms were followed during the patient's care in the ED.     25 y.o. male presents with back pain in lumbar area for 2 days without signs of radicular pain. No acute traumatic onset. No red flag symptoms of fever, weight loss, saddle anesthesia, weakness, fecal/urinary incontinence or urinary retention.   Suspect MSK etiology. No indication for imaging emergently. Patient was recommended to take short course of scheduled NSAIDs and engage in early mobility as definitive treatment. Return precautions discussed for worsening or new concerning symptoms.   Final Clinical Impression(s) / ED Diagnoses Final diagnoses:  Strain of lumbar region, initial encounter  Muscle spasm of back    The patient appears reasonably screened and/or stabilized for discharge and I doubt any other medical condition or other [facility] requiring further screening, evaluation, or treatment in the ED at this time prior to discharge. Safe for discharge with strict return precautions.  Disposition: Discharge  Condition: Good  I have discussed the results, Dx and Tx plan with the patient/family who expressed understanding and agree(s) with the plan. Discharge instructions discussed at length. The patient/family was given strict return precautions who verbalized understanding of the instructions. No further questions at time of discharge.    ED Discharge Orders          Ordered    cyclobenzaprine (FLEXERIL) 10 MG tablet  Daily at bedtime        09/18/21 2345            Follow Up: Primary care provider  Call  to schedule an appointment for close follow up    This chart was dictated using voice recognition software.  Despite best efforts to proofread,  errors can occur which can change the documentation meaning.    Nira Conn,  Warren 09/18/21 2348

## 2021-09-18 NOTE — Discharge Instructions (Signed)
You may use over-the-counter Motrin (Ibuprofen), Acetaminophen (Tylenol), topical muscle creams such as SalonPas, Icy Hot, Bengay, etc. Please stretch, apply ice or heat (whichever helps), and have massage therapy for additional assistance.  

## 2024-10-24 ENCOUNTER — Emergency Department (HOSPITAL_COMMUNITY): Payer: Self-pay

## 2024-10-24 ENCOUNTER — Encounter (HOSPITAL_COMMUNITY): Payer: Self-pay

## 2024-10-24 ENCOUNTER — Other Ambulatory Visit: Payer: Self-pay

## 2024-10-24 ENCOUNTER — Emergency Department (HOSPITAL_COMMUNITY)
Admission: EM | Admit: 2024-10-24 | Discharge: 2024-10-24 | Disposition: A | Discharge status: Home / Self Care | Payer: Self-pay | Attending: Emergency Medicine | Admitting: Emergency Medicine

## 2024-10-24 DIAGNOSIS — M25511 Pain in right shoulder: Secondary | ICD-10-CM

## 2024-10-24 MED ORDER — KETOROLAC TROMETHAMINE 15 MG/ML IJ SOLN
15.0000 mg | Freq: Once | INTRAMUSCULAR | Status: AC
  Administered 2024-10-24: 17:00:00 15 mg via INTRAMUSCULAR
  Filled 2024-10-24: qty 1

## 2024-10-24 MED ORDER — METHOCARBAMOL 500 MG PO TABS: 500.0000 mg | ORAL_TABLET | Freq: Two times a day (BID) | ORAL | 0 refills | Status: AC

## 2024-10-24 NOTE — ED Triage Notes (Signed)
 Pt c/o sharp shooting pain from right shoulder up to neck for past week

## 2024-10-24 NOTE — ED Provider Triage Note (Signed)
 Emergency Medicine Provider Triage Evaluation Note  Brendan Warren , a 28 y.o. male  was evaluated in triage.  Pt complains of has some chronic right shoulder pain and aggravated today at work, right shoulder pain radiating to scapula, has ortho for this.  Review of Systems  Positive: Shoulder pain Negative: Fever, CP  Physical Exam  BP 124/82 (BP Location: Left Arm)   Pulse 65   Temp (!) 97.5 F (36.4 C)   Resp 17   SpO2 100%  Gen:   Awake, no distress   Resp:  Normal effort  MSK:   Moves extremities without difficulty  Other:  TTP over right shoulder  Medical Decision Making  Medically screening exam initiated at 12:36 PM.  Appropriate orders placed.  Brendan Warren was informed that the remainder of the evaluation will be completed by another provider, this initial triage assessment does not replace that evaluation, and the importance of remaining in the ED until their evaluation is complete.     Shermon Warren SAILOR, PA-C 10/24/24 1237

## 2024-10-24 NOTE — ED Triage Notes (Signed)
 Pt states he tore his labrum in shoulder back in March, but last week at work , they had me doing things at work that [employer] not supposed to be doing. Pt states at work he pushed a pipe together and ever since then his right shoulder started hurting worse and now his has burning pain from right shoulder that radiates to right side of neck. PT c/o shooting pain in right shoulder that radiates down to finger tips. Pt has 2+ right radial pulse, cap refill less than 3 sec, warm to touch, 4/5 right grip strength.

## 2024-10-24 NOTE — ED Notes (Signed)
 Pt sitting up in bed, pt states that he is ready to go home, read and reviewed d/c instructions and follow up, script given, pt ambulatory from department.

## 2024-10-24 NOTE — ED Provider Notes (Signed)
 [place] EMERGENCY DEPARTMENT AT [facility] Provider Note   HPI/ROS    HPI/ROS:  Brendan Warren is a 28 y.o. male with a medical history as below who presents today for right shoulder pain that was worse after he was forced to take a trench by his job the other day.  States he tore his labrum partially earlier in March this year and feels that he may have completely torn it.  States that he came in today to get pain medication because he has an appointment on Thursday to follow-up with orthopedics but he has been able to keep his pain under control at home.  He states has been using Motrin  and naproxen at home with no relief.  Denies any headaches or visual changes.  But does state that the arm pain radiates up into the right side of his neck into his paraspinal muscles.  Denies any fevers, chills, chest pain, shortness breath, nausea, vomiting, diarrhea.  Past Medical History:  Diagnosis Date   ADHD (attention deficit hyperactivity disorder)    Anxiety    Heart murmur     Past Surgical History:  Procedure Laterality Date   NO PAST SURGERIES       MDM   See HPI section for pertinent HPI and ROS. A complete ROS was performed with pertinent positives/negatives noted above.   Physical exam is notable for: - Tenderness to palpation at the deltopectoral groove with pain with range of motion of the right shoulder.  2+ radial pulses bilaterally with intact sensation at both arms.   On my initial evaluation, patient is:  -Vital signs stable. Patient afebrile, hemodynamically stable, and non-toxic appearing.  This patient's current presentation, including their history and physical exam, is most consistent with worsening of labral tear/muscle strain. Does have some decreased grip strength on the right that he states is secondary to pain.  Otherwise no focal neurologic deficits concerning for CVA/TIA.  Good pulses so low concern for limb ischemia.  No neurologic findings consistent  with vertebral artery dissection.  Shoulder x-ray reviewed by myself here with no obvious fracture or dislocation.  Given pain with range of motion, tenderness to palpation near the acromion could also be an impingement syndrome.  Seems fully musculoskeletal and may require MRI for further characterization of his MSK injuries.  Seems to be more consistent with labral tear given weakness or at least a partial tear.  Patient already has orthopedics follow-up in the outpatient setting.  Will give a shot of Toradol  here and discharged home with Robaxin  given patient presented here today for pain control.  Patient discharged home in hemodynamically stable condition.  He was given strict turn precautions.  He plans to follow-up with orthopedics on Thursday.  Disposition:  I discussed the plan for discharge with the patient and/or their surrogate at bedside prior to discharge and they were in agreement with the plan and verbalized understanding of the return precautions provided. All questions answered to the best of my ability. Ultimately, the patient was discharged in stable condition with stable vital signs. I am reassured that they are capable of close follow up and good social support at home.   Clinical Impression:  1. Acute pain of right shoulder     Rx / DC Orders ED Discharge Orders          Ordered    methocarbamol  (ROBAXIN ) 500 MG tablet  2 times daily        10/24/24 1622  The plan for this patient was discussed with Dr. Cleotilde, who voiced agreement and who oversaw evaluation and treatment of this patient.   Clinical Complexity A medically appropriate history, review of systems, and physical exam was performed.  My independent interpretations of EKG, labs, and radiology are documented in the ED course above.   If decision rules were used in this patient's evaluation, they are listed below.   Click here for ABCD2, HEART and other calculatorsREFRESH Note before signing    Patient's presentation is most consistent with acute illness / injury with system symptoms.   Physical Exam   Vitals:   10/24/24 1134 10/24/24 1549  BP: 124/82 108/76  Pulse: 65 (!) 53  Resp: 17 15  Temp: (!) 97.5 F (36.4 C) 98 F (36.7 C)  SpO2: 100% 100%    Physical Exam Constitutional:      Appearance: Normal appearance.  HENT:     Head: Normocephalic and atraumatic.  Eyes:     Extraocular Movements: Extraocular movements intact.     Conjunctiva/sclera: Conjunctivae normal.  Cardiovascular:     Rate and Rhythm: Normal rate and regular rhythm.     Pulses:          Radial pulses are 2+ on the right side and 2+ on the left side.  Pulmonary:     Effort: Pulmonary effort is normal.     Breath sounds: Normal breath sounds. No wheezing or rales.  Abdominal:     General: Abdomen is flat.     Palpations: Abdomen is soft.     Tenderness: There is no abdominal tenderness. There is no guarding.  Musculoskeletal:     Cervical back: Normal range of motion.     Comments: 4.5/5 grip strength on the right, 5/5 grip strength on the left.  Intact sensation throughout the arm.  Full strength on flexion and extension at the elbow.  3 out of 5 strength at the right shoulder limited by pain.  Denies any sensory deficits throughout the rest of the right arm.  Skin:    General: Skin is warm and dry.     Capillary Refill: Capillary refill takes less than 2 seconds.  Neurological:     General: No focal deficit present.     Mental Status: He is alert and oriented to person, place, and time.      Procedures   If procedures were preformed on this patient, they are listed below:  Procedures   Glendia JAYSON Ancona MD, PGY-2  Please note that this documentation was produced with the assistance of voice-to-text technology and may contain errors.     Ancona Glendia, MD 10/24/24 8365    Cleotilde Rogue, MD 10/25/24 [phone]

## 2024-10-24 NOTE — Discharge Instructions (Signed)
 You were seen today for shoulder pain. While you were here we monitored your vitals, preformed a physical exam, and had negative shoulder x-rays. These were all reassuring and there is no indication for any further testing or intervention in the emergency department at this time.   Things to do:  - Follow up with your primary care provider within the next 1-2 weeks - Follow-up with orthopedic surgery on Thursday with your schedule ointment  Return to the emergency department if you have any new or worsening symptoms including worsening weakness of the arm, numbness or tingling going down the extremities, any acute worsening of the pain, or if you have any other concerns.
